# Patient Record
Sex: Female | Born: 1995
Health system: Southern US, Community
[De-identification: ages and names within clinical notes are randomized; demographics above are authoritative.]

## PROBLEM LIST (undated history)

## (undated) DIAGNOSIS — Q969 Turner's syndrome, unspecified: Secondary | ICD-10-CM

## (undated) DIAGNOSIS — E079 Disorder of thyroid, unspecified: Secondary | ICD-10-CM

## (undated) DIAGNOSIS — K529 Noninfective gastroenteritis and colitis, unspecified: Secondary | ICD-10-CM

## (undated) HISTORY — DX: Noninfective gastroenteritis and colitis, unspecified: K52.9

## (undated) HISTORY — DX: Turner's syndrome, unspecified: Q96.9

## (undated) HISTORY — DX: Disorder of thyroid, unspecified: E07.9

---
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
## 2016-01-04 ENCOUNTER — Encounter (HOSPITAL_COMMUNITY): Payer: Self-pay | Admitting: Emergency Medicine

## 2016-01-04 ENCOUNTER — Ambulatory Visit: Payer: Medicaid Other | Admitting: Internal Medicine

## 2016-01-04 DIAGNOSIS — K529 Noninfective gastroenteritis and colitis, unspecified: Secondary | ICD-10-CM | POA: Diagnosis not present

## 2016-01-04 DIAGNOSIS — R1010 Upper abdominal pain, unspecified: Secondary | ICD-10-CM | POA: Diagnosis present

## 2016-01-04 LAB — COMPREHENSIVE METABOLIC PANEL
ALK PHOS: 101 U/L (ref 38–126)
ALT: 20 U/L (ref 14–54)
ANION GAP: 10 (ref 5–15)
AST: 21 U/L (ref 15–41)
Albumin: 5 g/dL (ref 3.5–5.0)
BILIRUBIN TOTAL: 1.1 mg/dL (ref 0.3–1.2)
BUN: 16 mg/dL (ref 6–20)
CALCIUM: 9.2 mg/dL (ref 8.9–10.3)
CO2: 24 mmol/L (ref 22–32)
Chloride: 103 mmol/L (ref 101–111)
Creatinine, Ser: 0.61 mg/dL (ref 0.44–1.00)
GLUCOSE: 107 mg/dL — AB (ref 65–99)
POTASSIUM: 3.9 mmol/L (ref 3.5–5.1)
Sodium: 137 mmol/L (ref 135–145)
TOTAL PROTEIN: 7.6 g/dL (ref 6.5–8.1)

## 2016-01-04 LAB — CBC
HEMATOCRIT: 42.3 % (ref 36.0–46.0)
HEMOGLOBIN: 15.1 g/dL — AB (ref 12.0–15.0)
MCH: 31.5 pg (ref 26.0–34.0)
MCHC: 35.7 g/dL (ref 30.0–36.0)
MCV: 88.1 fL (ref 78.0–100.0)
Platelets: 370 10*3/uL (ref 150–400)
RBC: 4.8 MIL/uL (ref 3.87–5.11)
RDW: 12.1 % (ref 11.5–15.5)
WBC: 17 10*3/uL — ABNORMAL HIGH (ref 4.0–10.5)

## 2016-01-04 LAB — I-STAT BETA HCG BLOOD, ED (MC, WL, AP ONLY)

## 2016-01-04 LAB — LIPASE, BLOOD: Lipase: 28 U/L (ref 11–51)

## 2016-01-04 NOTE — ED Triage Notes (Signed)
Pt states that she has had NVD x 3 days and has been unable to eat or drink anything and keep it down. States that after she vomits the pain starts in her abdomen and radiates into her chest. Alert andoriented.

## 2016-01-04 NOTE — ED Notes (Addendum)
Pt's sister informed EMT first that pt is dehydrated and needs fluids and asked where the lab work is at. It was explained to the family member that we cannot discuss lab work but it anything was critical they would have called us. Furthermore, the RN explained and apologized that the department is full and there are a few people ahead of her but we are doing our best to get her back as soon as possible. Sister then stated that if she was not back in 30 mins she would want to talk to a patient advocate and would be filing a report. Charge RN made aware.

## 2016-01-05 ENCOUNTER — Emergency Department (HOSPITAL_COMMUNITY): Payer: Medicaid Other

## 2016-01-05 ENCOUNTER — Telehealth: Payer: Self-pay | Admitting: Internal Medicine

## 2016-01-05 ENCOUNTER — Encounter (HOSPITAL_COMMUNITY): Payer: Self-pay

## 2016-01-05 ENCOUNTER — Emergency Department (HOSPITAL_COMMUNITY)
Admission: EM | Admit: 2016-01-05 | Discharge: 2016-01-05 | Disposition: A | Discharge status: Home / Self Care | Payer: Medicaid Other | Attending: Emergency Medicine | Admitting: Emergency Medicine

## 2016-01-05 DIAGNOSIS — K529 Noninfective gastroenteritis and colitis, unspecified: Secondary | ICD-10-CM

## 2016-01-05 LAB — URINALYSIS, ROUTINE W REFLEX MICROSCOPIC
BILIRUBIN URINE: NEGATIVE
Glucose, UA: NEGATIVE mg/dL
HGB URINE DIPSTICK: NEGATIVE
Ketones, ur: 80 mg/dL — AB
NITRITE: NEGATIVE
Protein, ur: 30 mg/dL — AB
SPECIFIC GRAVITY, URINE: 1.032 — AB (ref 1.005–1.030)
pH: 6 (ref 5.0–8.0)

## 2016-01-05 MED ORDER — METRONIDAZOLE 500 MG PO TABS: 500.0000 mg | ORAL_TABLET | Freq: Three times a day (TID) | ORAL | 0 refills | Status: DC

## 2016-01-05 MED ORDER — IOPAMIDOL (ISOVUE-300) INJECTION 61%
100.0000 mL | Freq: Once | INTRAVENOUS | Status: AC | PRN
Start: 2016-01-05 — End: 2016-01-05
  Administered 2016-01-05: 80 mL via INTRAVENOUS

## 2016-01-05 MED ORDER — IOPAMIDOL (ISOVUE-300) INJECTION 61%
INTRAVENOUS | Status: AC
  Administered 2016-01-05: 80 mL via INTRAVENOUS
  Filled 2016-01-05: qty 100

## 2016-01-05 MED ORDER — SODIUM CHLORIDE 0.9 % IJ SOLN
INTRAMUSCULAR | Status: AC
  Filled 2016-01-05: qty 50

## 2016-01-05 MED ORDER — KETOROLAC TROMETHAMINE 30 MG/ML IJ SOLN
15.0000 mg | Freq: Once | INTRAMUSCULAR | Status: AC
  Administered 2016-01-05: 15 mg via INTRAVENOUS
  Filled 2016-01-05: qty 1

## 2016-01-05 MED ORDER — SODIUM CHLORIDE 0.9 % IV BOLUS (SEPSIS)
2000.0000 mL | INTRAVENOUS | Status: AC
  Administered 2016-01-05: 2000 mL via INTRAVENOUS

## 2016-01-05 MED ORDER — ONDANSETRON HCL 4 MG/2ML IJ SOLN
4.0000 mg | Freq: Once | INTRAMUSCULAR | Status: AC
  Administered 2016-01-05: 4 mg via INTRAVENOUS
  Filled 2016-01-05: qty 2

## 2016-01-05 MED ORDER — PROMETHAZINE HCL 25 MG PO TABS: 25.0000 mg | ORAL_TABLET | Freq: Four times a day (QID) | ORAL | 0 refills | Status: DC | PRN

## 2016-01-05 MED ORDER — CIPROFLOXACIN HCL 500 MG PO TABS: 500.0000 mg | ORAL_TABLET | Freq: Two times a day (BID) | ORAL | 0 refills | Status: DC

## 2016-01-05 MED ORDER — FAMOTIDINE IN NACL 20-0.9 MG/50ML-% IV SOLN
20.0000 mg | INTRAVENOUS | Status: AC
  Administered 2016-01-05: 20 mg via INTRAVENOUS
  Filled 2016-01-05: qty 50

## 2016-01-05 MED ORDER — DICYCLOMINE HCL 20 MG PO TABS: 20.0000 mg | ORAL_TABLET | Freq: Two times a day (BID) | ORAL | 0 refills | Status: DC | PRN

## 2016-01-05 NOTE — ED Notes (Signed)
Spoke with pt family member due to wait time. Pt family member unhappy with pt still in lobby. Pt family advised that staff was working diligently to get pt back as quickly as possible. Labs have been drawn and resulted at this time. AC notified of pt family request for grievance.

## 2016-01-05 NOTE — ED Notes (Signed)
Up to b/r, steady gait, states, "feel fine", [facility], [facility] CT results.

## 2016-01-05 NOTE — Telephone Encounter (Signed)
Patient was in the ED all night for ? Colitis.  ED note is not complete.  Mom is asking about doing the stool studies that were ordered back in October that they never completed. She is advised that she should be seen in the office again. She  Can't come in until next week for OV.  She is offered an appt for Monday, but she is not able to make it.  She will come in and see Doug SouJessica Zehr, PA on 01/12/16 2:00

## 2016-01-05 NOTE — ED Notes (Signed)
No changes, alert, NAD, calm. [facility]

## 2016-01-05 NOTE — Discharge Instructions (Signed)
Take Ciprofloxacin and Flagyl as prescribed. You may take Bentyl for abdominal pain that is not well controlled with Tylenol or ibuprofen. You may take Phenergan for nausea/vomiting as needed. Be sure to remain well hydrated by drinking plenty of clear liquids. Return to the emergency department for worsening symptoms. Otherwise, follow up with your gastroenterologist and primary care doctor.

## 2016-01-05 NOTE — ED Notes (Signed)
Updated with plan, process and rationale, timeframe given. Pt alert, NAD, calm, interactive, resps e/u, sister leaving BS.

## 2016-01-05 NOTE — ED Notes (Signed)
Lab results reviewed, pt resting comfortably, NAD, calm, interactive, resps e/u, no dyspnea noted, skin W&D, c/o abd pain, 6/10, also nausea. Emesis resolved (nothing left to vomit, no wretching noted), reports diarrhea resolved 2d ago (was watery and brown), last BM today (formed, small), (denies: dizziness, sob, constipation, vaginal or urinary sx, bleeding, fever or other sx). [facility]

## 2016-01-05 NOTE — ED Notes (Signed)
CT scan delayed d/t ICU pt, pt updated, alert, NAD, calm, polite, cooperative, denies questions or needs at this time.

## 2016-01-05 NOTE — ED Provider Notes (Signed)
[facility]-EMERGENCY DEPT Provider Note   CSN: [id number] Arrival date & time: 01/04/16  2009    History   Chief Complaint Chief Complaint  Patient presents with  . Abdominal Pain  . Emesis    HPI Rebecca Mack is a 20 y.o. female.  20 year old female with a history of Turner syndrome presents to the emergency department for evaluation of nausea and vomiting as well as abdominal pain. Symptoms have been worsening over the past 3 days. Patient reports that she has been unable to eat or drink anything without vomiting. She notes that eating and drinking makes her pain worse. She is complaining primarily of upper abdominal pain. This radiates up into her chest. She denies radiation through to her back. Patient has not taken any medications for symptoms, because she is unable to tolerate them. She has had bowel movements and is passing gas. No melena or hematochezia. No associated fevers. Patient reports a history of colitis. She is followed by a gastroenterologist. No history of abdominal surgeries.   The history is provided by the patient. No language interpreter was used.    Past Medical History:  Diagnosis Date  . Thyroid disease   . Turner's syndrome     There are no active problems to display for this patient.   History reviewed. No pertinent surgical history.  OB History    No data available       Home Medications    Prior to Admission medications   Medication Sig Start Date End Date Taking? Authorizing Provider  estradiol (ESTRACE) 1 MG tablet Take 0.5 mg by mouth daily.   Yes Historical Provider, MD  levothyroxine (SYNTHROID, LEVOTHROID) 75 MCG tablet  09/07/14  Yes Historical Provider, MD  ciprofloxacin (CIPRO) 500 MG tablet Take 1 tablet (500 mg total) by mouth every 12 (twelve) hours. 01/05/16   Antony MaduraKelly Brenton Joines, PA-C  dicyclomine (BENTYL) 20 MG tablet Take 1 tablet (20 mg total) by mouth 2 (two) times daily as needed (abdominal pain/cramping). 01/05/16   Antony MaduraKelly Daanya Lanphier,  PA-C  metroNIDAZOLE (FLAGYL) 500 MG tablet Take 1 tablet (500 mg total) by mouth 3 (three) times daily. 01/05/16   Antony MaduraKelly Mikiah Durall, PA-C  promethazine (PHENERGAN) 25 MG tablet Take 1 tablet (25 mg total) by mouth every 6 (six) hours as needed for nausea or vomiting. 01/05/16   Antony MaduraKelly Milagro Belmares, PA-C    Family History Family History  Problem Relation Age of Onset  . Thyroid disease Mother   . Lung disease Mother   . Other Mother     underdeveloped colon  . Diabetes type II Mother   . Neurologic Disorder Mother   . Colon polyps Maternal Grandmother     Social History Social History  Substance Use Topics  . Smoking status: Never Smoker  . Smokeless tobacco: Not on file  . Alcohol use No     Allergies   Other   Review of Systems Review of Systems Ten systems reviewed and are negative for acute change, except as noted in the HPI.    Physical Exam Updated Vital Signs BP 108/69 (BP Location: Left Arm)   Pulse 105   Temp 97.8 F (36.6 C) (Oral)   Resp 19   SpO2 97%   Physical Exam  Constitutional: She is oriented to person, place, and time. She appears well-developed and well-nourished. No distress.  Nontoxic/nonseptic appearing  HENT:  Head: Normocephalic and atraumatic.  Eyes: Conjunctivae and EOM are normal. No scleral icterus.  Neck: Normal range of motion.  Cardiovascular:  Regular rhythm and intact distal pulses.   Borderline tachycardia  Pulmonary/Chest: Effort normal. No respiratory distress. She has no wheezes. She has no rales.  Lungs CTAB  Abdominal: Soft. There is tenderness. There is guarding. There is no rebound.  Mild distension with epigastric TTP as well as LUQ and LLQ tenderness. No masses or peritoneal signs.  Musculoskeletal: Normal range of motion.  Neurological: She is alert and oriented to person, place, and time. She exhibits normal muscle tone. Coordination normal.  Skin: Skin is warm and dry. No rash noted. She is not diaphoretic. No erythema. No  pallor.  Psychiatric: She has a normal mood and affect. Her behavior is normal.  Nursing note and vitals reviewed.    ED Treatments / Results  Labs (all labs ordered are listed, but only abnormal results are displayed) Labs Reviewed  COMPREHENSIVE METABOLIC PANEL - Abnormal; Notable for the following:       Result Value   Glucose, Bld 107 (*)    All other components within normal limits  CBC - Abnormal; Notable for the following:    WBC 17.0 (*)    Hemoglobin 15.1 (*)    All other components within normal limits  URINALYSIS, ROUTINE W REFLEX MICROSCOPIC - Abnormal; Notable for the following:    APPearance HAZY (*)    Specific Gravity, Urine 1.032 (*)    Ketones, ur 80 (*)    Protein, ur 30 (*)    Leukocytes, UA MODERATE (*)    Bacteria, UA RARE (*)    Squamous Epithelial / LPF 6-30 (*)    All other components within normal limits  LIPASE, BLOOD  I-STAT BETA HCG BLOOD, ED ([facility], [facility], [facility] ONLY)    EKG  EKG Interpretation None       Radiology Ct Abdomen Pelvis W Contrast  Result Date: 01/05/2016 CLINICAL DATA:  20 year old female with abdominal pain, nausea and vomiting. EXAM: CT ABDOMEN AND PELVIS WITH CONTRAST TECHNIQUE: Multidetector CT imaging of the abdomen and pelvis was performed using the standard protocol following bolus administration of intravenous contrast. CONTRAST:  80 cc Isovue 300 COMPARISON:  Abdominal CT dated 12/20/2014 FINDINGS: Lower chest: The visualized lung bases are clear. No intra-abdominal free air or free fluid. Hepatobiliary: No focal liver abnormality is seen. No gallstones, gallbladder wall thickening, or biliary dilatation. Pancreas: Unremarkable. No pancreatic ductal dilatation or surrounding inflammatory changes. Spleen: Normal in size without focal abnormality. Adrenals/Urinary Tract: Adrenal glands are unremarkable. Kidneys are normal, without renal calculi, focal lesion, or hydronephrosis. Bladder is unremarkable. Stomach/Bowel: There is mild  diffuse thickened appearance of the colon with mild haziness of the omentum along the transverse colon concerning for mild colitis. Correlation with clinical exam and stool cultures recommended. The stomach is decompressed. There is no evidence of bowel obstruction. Normal appendix. Vascular/Lymphatic: The abdominal aorta and IVC appear unremarkable. No portal venous gas identified. There is no adenopathy. Top-normal mesenteric and right lower quadrant/pericecal lymph nodes noted, possibly reactive. Reproductive: The uterus is grossly unremarkable. Other: None Musculoskeletal: No acute or significant osseous findings. IMPRESSION: Mild diffuse appearance of the colon primarily involving the transverse segment concerning for mild colitis. Correlation with clinical exam is recommended. No bowel obstruction. Normal appendix. Electronically Signed   By: Elgie CollardArash  Radparvar M.D.   On: 01/05/2016 05:51    Procedures Procedures (including critical care time)  Medications Ordered in ED Medications  sodium chloride 0.9 % bolus 2,000 mL (0 mLs Intravenous Stopped 01/05/16 0439)  ondansetron (ZOFRAN) injection 4 mg (4 mg  Intravenous Given 01/05/16 0359)  famotidine (PEPCID) IVPB 20 mg premix (0 mg Intravenous Stopped 01/05/16 0439)  ketorolac (TORADOL) 30 MG/ML injection 15 mg (15 mg Intravenous Given 01/05/16 0359)  iopamidol (ISOVUE-300) 61 % injection 100 mL (80 mLs Intravenous Contrast Given 01/05/16 0534)     Initial Impression / Assessment and Plan / ED Course  I have reviewed the triage vital signs and the nursing notes.  Pertinent labs & imaging results that were available during my care of the patient were reviewed by me and considered in my medical decision making (see chart for details).  Clinical Course     20 year old female presents to the emergency department for evaluation of abdominal pain. She has a leukocytosis today, but is afebrile. Patient nontoxic and without evidence of acute  surgical abdomen, though she is focally tender in the epigastric abdomen and left upper quadrant. CT scan obtained given history of colitis. There is evidence of uncomplicated colitis, again, today. Suspect infectious etiology.   Patient feels better after receiving fluids, Toradol, Pepcid, and Zofran. She has been able to tolerate oral fluids in the emergency department without significant worsening of her pain or vomiting. I believe she is stable for outpatient management at this time. Will start on ciprofloxacin and Flagyl. Patient prescribed Bentyl to take for pain. She has a gastroenterologist with whom she is followed by. The patient is able to follow-up with her GI doctor on an outpatient basis as well. Return precautions discussed and provided. Patient discharged in stable condition with no unaddressed concerns.   Final Clinical Impressions(s) / ED Diagnoses   Final diagnoses:  Colitis    New Prescriptions Discharge Medication List as of 01/05/2016  6:52 AM    START taking these medications   Details  ciprofloxacin (CIPRO) 500 MG tablet Take 1 tablet (500 mg total) by mouth every 12 (twelve) hours., Starting Thu 01/05/2016, Print    dicyclomine (BENTYL) 20 MG tablet Take 1 tablet (20 mg total) by mouth 2 (two) times daily as needed (abdominal pain/cramping)., Starting Thu 01/05/2016, Print    metroNIDAZOLE (FLAGYL) 500 MG tablet Take 1 tablet (500 mg total) by mouth 3 (three) times daily., Starting Thu 01/05/2016, Print    promethazine (PHENERGAN) 25 MG tablet Take 1 tablet (25 mg total) by mouth every 6 (six) hours as needed for nausea or vomiting., Starting Thu 01/05/2016, Print         [place], PA-C 01/06/16 1610    Arby Barrette, MD 01/14/16 0800

## 2016-01-05 NOTE — ED Notes (Signed)
updated

## 2016-01-05 NOTE — ED Notes (Signed)
Bed: WA05 Expected date:  Expected time:  Means of arrival:  Comments: 

## 2016-01-12 ENCOUNTER — Ambulatory Visit: Payer: Medicaid Other | Admitting: Gastroenterology

## 2016-01-25 ENCOUNTER — Ambulatory Visit (INDEPENDENT_AMBULATORY_CARE_PROVIDER_SITE_OTHER): Payer: Medicaid Other | Admitting: Gastroenterology

## 2016-01-25 ENCOUNTER — Encounter (INDEPENDENT_AMBULATORY_CARE_PROVIDER_SITE_OTHER): Payer: Self-pay

## 2016-01-25 ENCOUNTER — Encounter: Payer: Self-pay | Admitting: Gastroenterology

## 2016-01-25 ENCOUNTER — Ambulatory Visit: Payer: Medicaid Other | Admitting: Gastroenterology

## 2016-01-25 VITALS — BP 108/70 | HR 62 | Ht <= 58 in | Wt 120.0 lb

## 2016-01-25 DIAGNOSIS — R197 Diarrhea, unspecified: Secondary | ICD-10-CM | POA: Diagnosis not present

## 2016-01-25 DIAGNOSIS — R938 Abnormal findings on diagnostic imaging of other specified body structures: Secondary | ICD-10-CM

## 2016-01-25 DIAGNOSIS — R9389 Abnormal findings on diagnostic imaging of other specified body structures: Secondary | ICD-10-CM

## 2016-01-25 NOTE — Patient Instructions (Signed)

## 2016-01-25 NOTE — Progress Notes (Signed)
     01/25/2016 Rebecca BarbMeghan Mack [id number] 06/20/1995   History of Present Illness:  This is a 21 year old female with Turner's syndrome who now has now had two episodes of diarrhea and abdominal pain that have shown some changes c/w colitis on CT scan (11/2014 and now 12/2015).  Treated as infectious on both occasions.  At the point her symptoms have completely resolved again, but even between these episodes she sometimes has episodes of diarrhea, etc.  No bleeding with the most recent episode but had bleeding with her episode in 11/2014.  Had WBC count elevation of 17 at her ER visit in December.  CT scan as follows:    IMPRESSION: Mild diffuse appearance of the colon primarily involving the transverse segment concerning for mild colitis. Correlation with clinical exam is recommended. No bowel obstruction. Normal appendix.   Current Medications, Allergies, Past Medical History, Past Surgical History, Family History and Social History were reviewed in [facility] electronic medical record.   Physical Exam: BP 108/70   Pulse 62   Ht 4' 5.5" (1.359 m)   Wt 120 lb (54.4 kg)   BMI 29.48 kg/m  General: Well developed white female in no acute distress; Turner's body habitus. Head: Normocephalic and atraumatic Eyes:  Sclerae anicteric, conjunctiva pink  Ears: Normal auditory acuity Lungs: Clear throughout to auscultation.  No increased WOB. Heart: Regular rate and rhythm. Abdomen: Soft, non-distended.  Normal bowel sounds.  Non-tender. Rectal:  Will be done at the time of colonoscopy. Musculoskeletal: Symmetrical with no gross deformities  Extremities: No edema  Neurological: Alert oriented x 4, grossly non-focal Psychological:  Alert and cooperative. Normal mood and affect  Assessment and Recommendations: -21 year old female with now two episodes of diarrhea, abdominal pain that have shown some changes c/w colitis on CT scan.  Treated as infectious on both occasions.  I think at this  point we should proceed with colonoscopy to be sure that she does not have any underlying inflammatory bowel disease.  Patient and mother are in agreement.  Will schedule for colonoscopy with Dr. Leone PayorGessner.  The risks, benefits, and alternatives to colonoscopy were discussed with the patient and she consents to proceed.

## 2016-01-26 NOTE — Progress Notes (Signed)
Agree with Ms. Zehr's management.  Kenichi Cassada E. Kobey Sides, MD, FACG  

## 2016-02-27 ENCOUNTER — Ambulatory Visit: Payer: Medicaid Other | Admitting: Internal Medicine

## 2016-03-20 ENCOUNTER — Telehealth: Payer: Self-pay | Admitting: Gastroenterology

## 2016-03-21 NOTE — Telephone Encounter (Signed)
The pt was advised to take imodium as needed until the time she needs to prep for her colon.

## 2016-03-29 ENCOUNTER — Encounter: Payer: Medicaid Other | Admitting: Internal Medicine

## 2016-04-13 ENCOUNTER — Other Ambulatory Visit (HOSPITAL_COMMUNITY): Payer: Self-pay | Admitting: Internal Medicine

## 2016-04-13 DIAGNOSIS — Q969 Turner's syndrome, unspecified: Secondary | ICD-10-CM

## 2016-04-27 ENCOUNTER — Ambulatory Visit (HOSPITAL_COMMUNITY): Payer: Medicaid Other

## 2016-05-09 ENCOUNTER — Ambulatory Visit (HOSPITAL_COMMUNITY): Payer: Medicaid Other

## 2016-05-18 ENCOUNTER — Ambulatory Visit (HOSPITAL_COMMUNITY): Payer: Medicaid Other

## 2016-05-25 ENCOUNTER — Ambulatory Visit (HOSPITAL_COMMUNITY): Payer: Medicaid Other

## 2016-06-06 ENCOUNTER — Ambulatory Visit (HOSPITAL_COMMUNITY)
Admission: RE | Admit: 2016-06-06 | Discharge: 2016-06-06 | Disposition: A | Discharge status: Home / Self Care | Payer: Medicaid Other | Source: Ambulatory Visit | Attending: Internal Medicine | Admitting: Internal Medicine

## 2016-06-06 DIAGNOSIS — Q969 Turner's syndrome, unspecified: Secondary | ICD-10-CM | POA: Diagnosis not present

## 2016-06-06 NOTE — Progress Notes (Signed)
  Echocardiogram 2D Echocardiogram has been performed.  Leta JunglingCooper, Keziah Avis M 06/06/2016, 2:45 PM

## 2016-06-12 ENCOUNTER — Encounter: Payer: Medicaid Other | Admitting: Internal Medicine

## 2016-09-04 ENCOUNTER — Ambulatory Visit: Payer: Self-pay | Admitting: Family Medicine

## 2016-09-14 ENCOUNTER — Ambulatory Visit: Payer: Medicaid Other | Admitting: Family Medicine

## 2016-10-12 ENCOUNTER — Ambulatory Visit: Payer: Self-pay | Admitting: Endocrinology

## 2016-11-16 ENCOUNTER — Ambulatory Visit: Payer: Self-pay | Admitting: Endocrinology

## 2017-01-04 ENCOUNTER — Ambulatory Visit: Payer: Self-pay | Admitting: Endocrinology

## 2017-02-06 ENCOUNTER — Ambulatory Visit: Payer: Self-pay | Admitting: Endocrinology

## 2017-02-06 DIAGNOSIS — Z0289 Encounter for other administrative examinations: Secondary | ICD-10-CM

## 2017-11-07 IMAGING — CT CT ABD-PELV W/ CM
2 of 4 series · 16 of 46 positions shown, 18 images · IV contrast (ISOVUE)
Comparison: Abdominal CT dated 12/20/2014

CLINICAL DATA: 20-year-old female with abdominal pain, nausea and
vomiting.

EXAM:
CT ABDOMEN AND PELVIS WITH CONTRAST
TECHNIQUE: Multidetector CT imaging of the abdomen and pelvis was performed
using the standard protocol following bolus administration of
intravenous contrast.
CONTRAST:  80 cc Isovue 300

[Series 2: abd/pel with · axial · 0.65mm/px · z∈[+1325,+1635]mm · 13 of 70 slices shown, 15 images]
[im 4/70  soft-tissue]
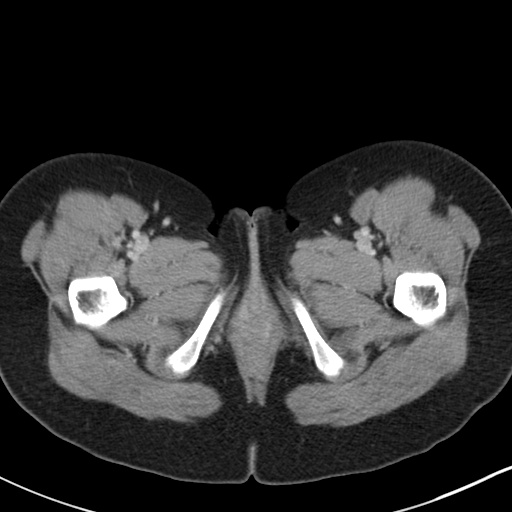
[im 4/70  bone]
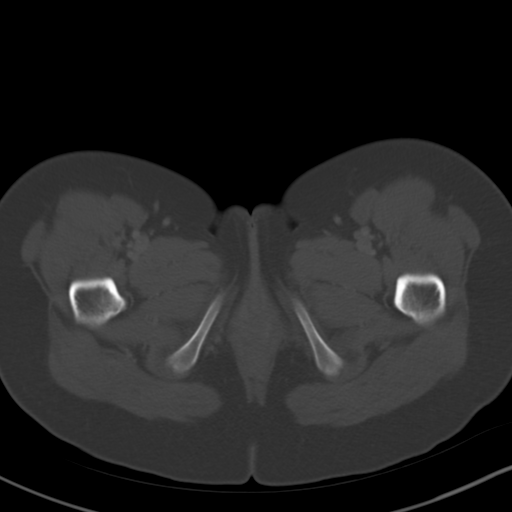
[im 11/70  soft-tissue]
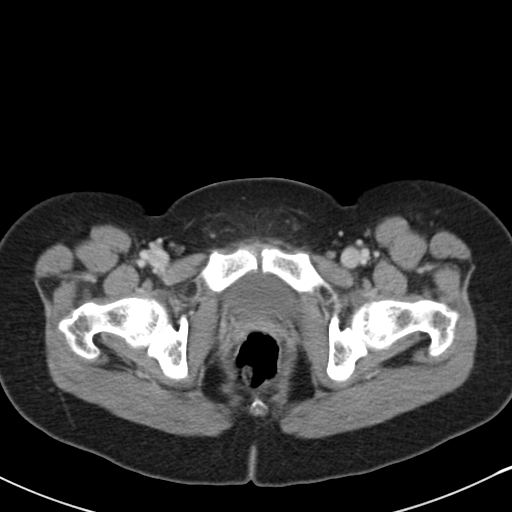
[im 14/70  soft-tissue]
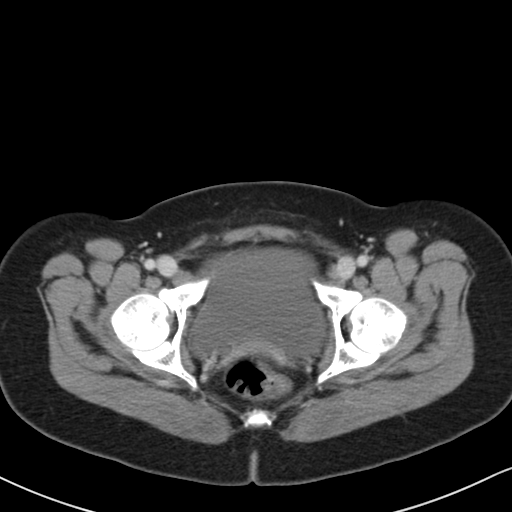
[im 21/70  soft-tissue]
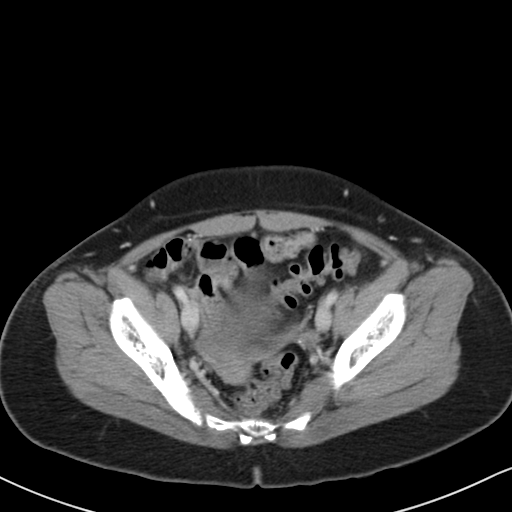
[im 25/70  soft-tissue]
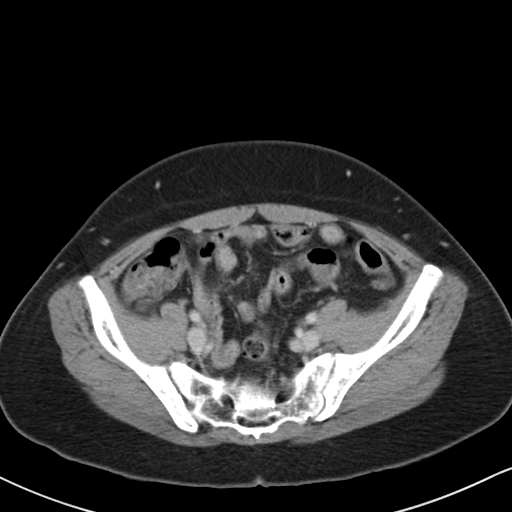
[im 32/70  soft-tissue]
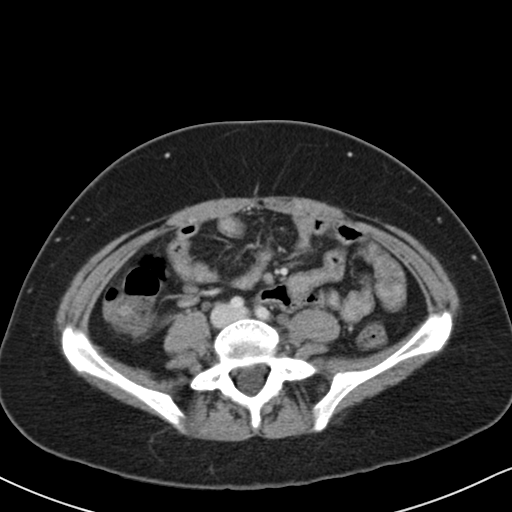
[im 35/70  soft-tissue]
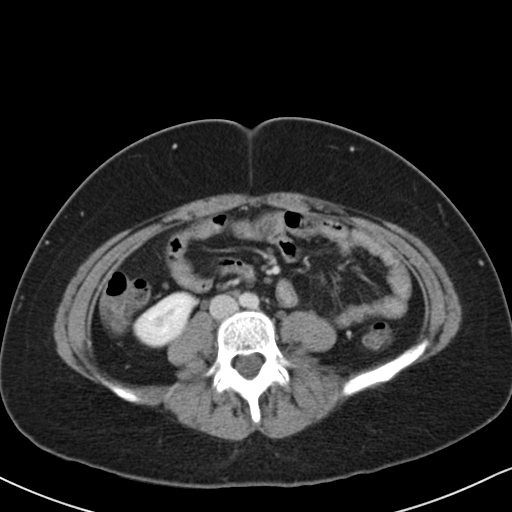
[im 38/70  soft-tissue]
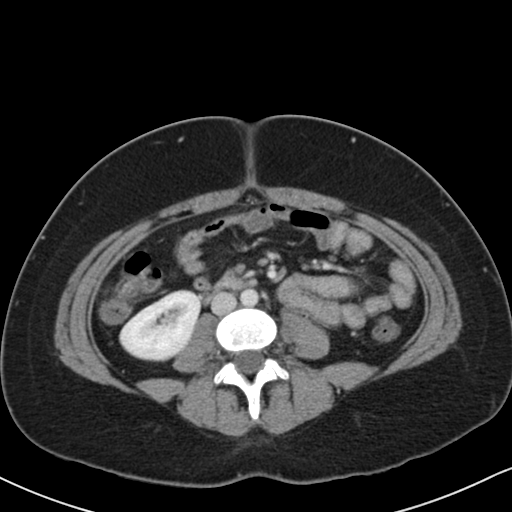
[im 45/70  soft-tissue]
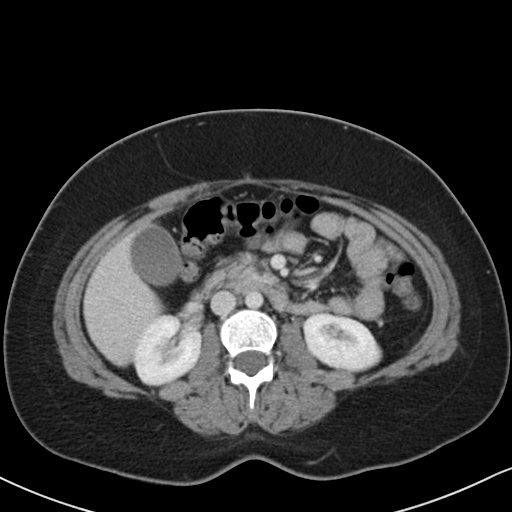
[im 45/70  bone]
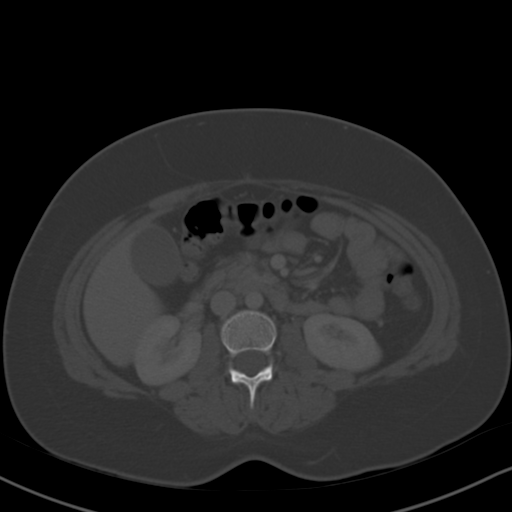
[im 49/70  soft-tissue]
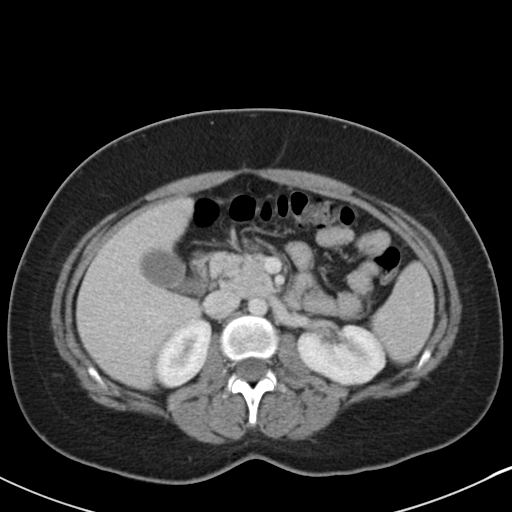
[im 56/70  soft-tissue]
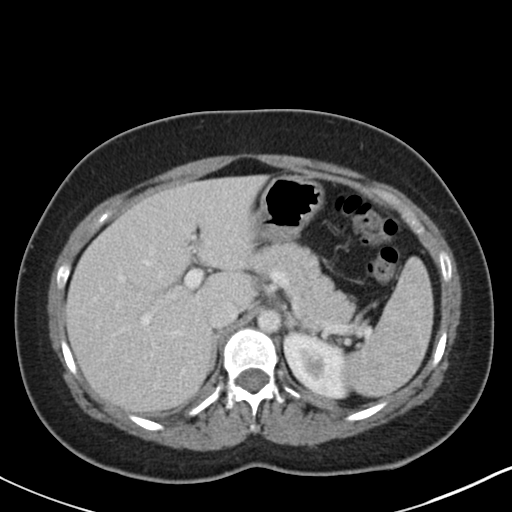
[im 59/70  soft-tissue]
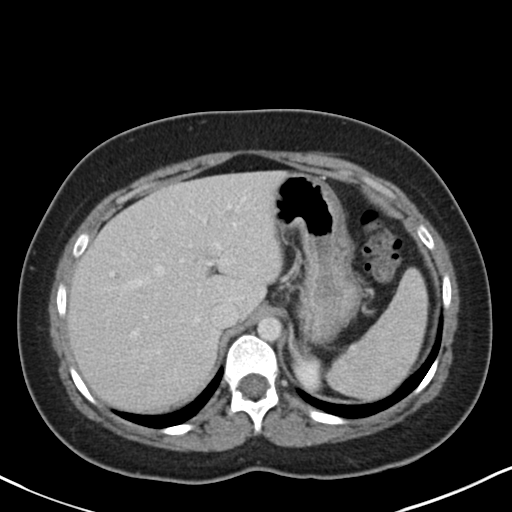
[im 66/70  soft-tissue]
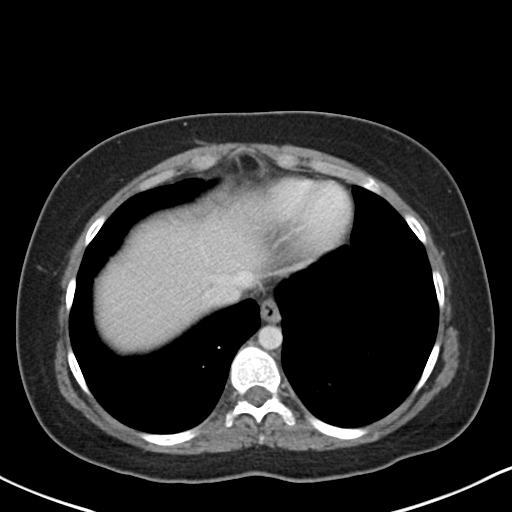

[Series 4: coronal a/|p · coronal · 0.58mm/px · 3 of 108 slices shown]
[im 36/108  soft-tissue]
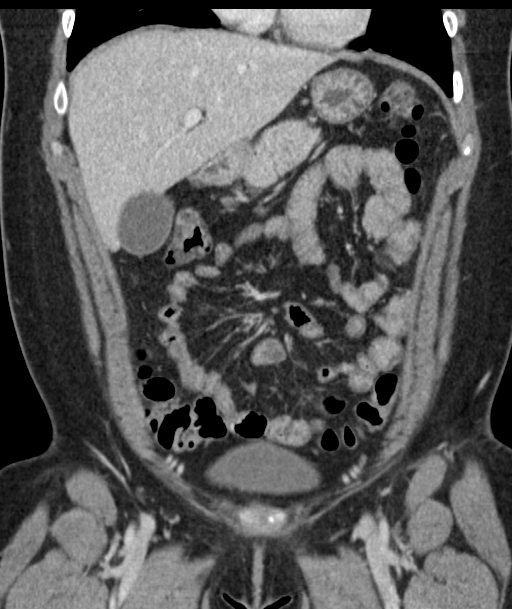
[im 48/108  soft-tissue]
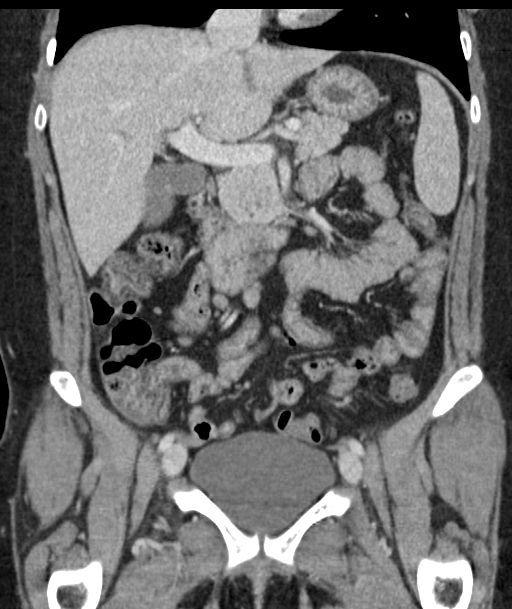
[im 60/108  soft-tissue]
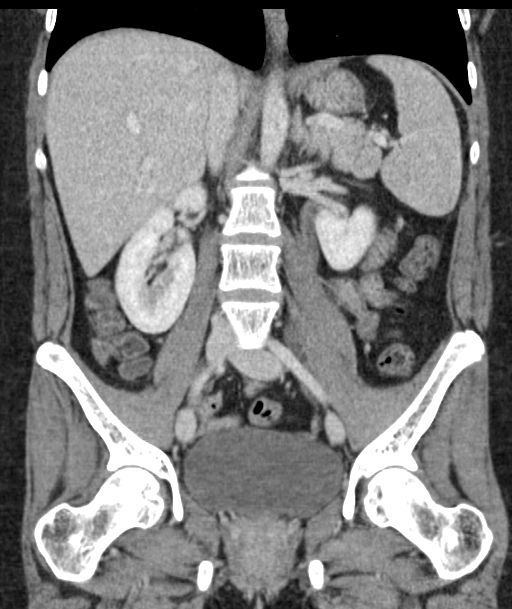

[16 of 46 positions shown; findings below may reference images not displayed]

FINDINGS: Lower chest: The visualized lung bases are clear.

No intra-abdominal free air or free fluid.

Hepatobiliary: No focal liver abnormality is seen. No gallstones,
gallbladder wall thickening, or biliary dilatation.

Pancreas: Unremarkable. No pancreatic ductal dilatation or
surrounding inflammatory changes.

Spleen: Normal in size without focal abnormality.

Adrenals/Urinary Tract: Adrenal glands are unremarkable. Kidneys are
normal, without renal calculi, focal lesion, or hydronephrosis.
Bladder is unremarkable.

Stomach/Bowel: There is mild diffuse thickened appearance of the
colon with mild haziness of the omentum along the transverse colon
concerning for mild colitis. Correlation with clinical exam and
stool cultures recommended. The stomach is decompressed. There is no
evidence of bowel obstruction. Normal appendix.

Vascular/Lymphatic: The abdominal aorta and IVC appear unremarkable.
No portal venous gas identified. There is no adenopathy. Top-normal
mesenteric and right lower quadrant/pericecal lymph nodes noted,
possibly reactive.

Reproductive: The uterus is grossly unremarkable.

Other: None

Musculoskeletal: No acute or significant osseous findings.
IMPRESSION: Mild diffuse appearance of the colon primarily involving the
transverse segment concerning for mild colitis. Correlation with
clinical exam is recommended. No bowel obstruction. Normal appendix.

## 2019-06-01 ENCOUNTER — Telehealth: Payer: Medicaid Other

## 2021-04-15 ENCOUNTER — Telehealth: Payer: Self-pay | Admitting: Nurse Practitioner

## 2021-04-15 DIAGNOSIS — J0100 Acute maxillary sinusitis, unspecified: Secondary | ICD-10-CM

## 2021-04-15 MED ORDER — AMOXICILLIN-POT CLAVULANATE 875-125 MG PO TABS: 1.0000 | ORAL_TABLET | Freq: Two times a day (BID) | ORAL | 0 refills | Status: AC

## 2021-04-15 MED ORDER — FLUTICASONE PROPIONATE 50 MCG/ACT NA SUSP: 2.0000 | Freq: Every day | NASAL | 6 refills | Status: AC

## 2021-04-15 NOTE — Patient Instructions (Signed)
1. Take meds as prescribed ?2. Use a cool mist humidifier especially during the winter months and when heat has been humid. ?3. Use saline nose sprays frequently ?4. Saline irrigations of the nose can be very helpful if done frequently. ? * 4X daily for 1 week* ? * Use of a nettie pot can be helpful with this. Follow directions with this* ?5. Drink plenty of fluids ?6. Keep thermostat turn down low ?7.For any cough or congestion- mucinex D ?8. For fever or aces or pains- take tylenol or ibuprofen appropriate for age and weight. ? * for fevers greater than 101 orally you may alternate ibuprofen and tylenol every  3 hours. ?  ? ?

## 2021-04-15 NOTE — Progress Notes (Signed)
? ?Virtual Visit Consent  ? ?Rebecca Mack, you are scheduled for a virtual visit with Mary-Margaret Daphine Deutscher, FNP, a [facility] provider, today.   ?  ?Just as with appointments in the office, your consent must be obtained to participate.  Your consent will be active for this visit and any virtual visit you may have with one of our providers in the next 365 days.   ?  ?If you have a MyChart account, a copy of this consent can be sent to you electronically.  All virtual visits are billed to your insurance company just like a traditional visit in the office.   ? ?As this is a virtual visit, video technology does not allow for your provider to perform a traditional examination.  This may limit your provider's ability to fully assess your condition.  If your provider identifies any concerns that need to be evaluated in person or the need to arrange testing (such as labs, EKG, etc.), we will make arrangements to do so.   ?  ?Although advances in technology are sophisticated, we cannot ensure that it will always work on either your end or our end.  If the connection with a video visit is poor, the visit may have to be switched to a telephone visit.  With either a video or telephone visit, we are not always able to ensure that we have a secure connection.    ? ?I need to obtain your verbal consent now.   Are you willing to proceed with your visit today? YES ?  ?Tian Davison has provided verbal consent on 04/15/2021 for a virtual visit (video or telephone). ?  ?Mary-Margaret Daphine Deutscher, FNP  ? ?Date: 04/15/2021 1:20 PM ? ? ?Virtual Visit via Video Note  ? ?I, Mary-Margaret Daphine Deutscher, connected with Brissa Asante ([id number], 08/14/1995) on 04/15/21 at  1:15 PM EDT by a video-enabled telemedicine application and verified that I am speaking with the correct person using two identifiers. ? ?Location: ?Patient: Virtual Visit Location Patient: Home ?Provider: Virtual Visit Location Provider: Mobile ?  ?I discussed the limitations of  evaluation and management by telemedicine and the availability of in person appointments. The patient expressed understanding and agreed to proceed.   ? ?History of Present Illness: ?Rebecca Mack is a 26 y.o. who identifies as a female who was assigned female at birth, and is being seen today for sinusitis. ? ?HPI: Sinus Problem ?This is a new problem. The current episode started in the past 7 days. The problem has been waxing and waning since onset. The maximum temperature recorded prior to her arrival was 100.4 - 100.9 F. The fever has been present for 1 to 2 days. Her pain is at a severity of 4/10. The pain is moderate. Associated symptoms include congestion, coughing, headaches, sinus pressure and a sore throat. Past treatments include oral decongestants. The treatment provided mild relief.   ?Review of Systems  ?HENT:  Positive for congestion, sinus pressure and sore throat.   ?Respiratory:  Positive for cough.   ?Neurological:  Positive for headaches.  ? ?Problems:  ?Patient Active Problem List  ? Diagnosis Date Noted  ? Diarrhea 01/25/2016  ? Abnormal CT scan 01/25/2016  ?  ?Allergies:  ?Allergies  ?Allergen Reactions  ? Other Other (See Comments)  ?  Pt cant take steroids.   ? ?Medications:  ?Current Outpatient Medications:  ?  amoxicillin-clavulanate (AUGMENTIN) 875-125 MG tablet, Take 1 tablet by mouth 2 (two) times daily., Disp: 14 tablet, Rfl: 0 ?  fluticasone (  FLONASE) 50 MCG/ACT nasal spray, Place 2 sprays into both nostrils daily., Disp: 16 g, Rfl: 6 ?  estradiol (ESTRACE) 1 MG tablet, Take 0.5 mg by mouth daily., Disp: , Rfl:  ?  levothyroxine (SYNTHROID, LEVOTHROID) 75 MCG tablet, , Disp: , Rfl: 0 ? ?Observations/Objective: ?Patient is well-developed, well-nourished in no acute distress.  ?Resting comfortably  at home.  ?Head is normocephalic, atraumatic.  ?No labored breathing.  ?Speech is clear and coherent with logical content.  ?Patient is alert and oriented at baseline.  ?Hoarse voice ?No  cough noted during visit ? ?Assessment and Plan: ? ?Angela Plair in today with chief complaint of Sinus Problem ? ? ?1. Acute non-recurrent maxillary sinusitis ?1. Take meds as prescribed ?2. Use a cool mist humidifier especially during the winter months and when heat has been humid. ?3. Use saline nose sprays frequently ?4. Saline irrigations of the nose can be very helpful if done frequently. ? * 4X daily for 1 week* ? * Use of a nettie pot can be helpful with this. Follow directions with this* ?5. Drink plenty of fluids ?6. Keep thermostat turn down low ?7.For any cough or congestion- mucinex D ?8. For fever or aces or pains- take tylenol or ibuprofen appropriate for age and weight. ? * for fevers greater than 101 orally you may alternate ibuprofen and tylenol every  3 hours. ?  ? ?Meds ordered this encounter  ?Medications  ? fluticasone (FLONASE) 50 MCG/ACT nasal spray  ?  Sig: Place 2 sprays into both nostrils daily.  ?  Dispense:  16 g  ?  Refill:  6  ?  Order Specific Question:   Supervising Provider  ?  Answer:   Eber Hong [[id number]]  ? amoxicillin-clavulanate (AUGMENTIN) 875-125 MG tablet  ?  Sig: Take 1 tablet by mouth 2 (two) times daily.  ?  Dispense:  14 tablet  ?  Refill:  0  ?  Order Specific Question:   Supervising Provider  ?  Answer:   Eber Hong [[id number]]  ? ? ? ? ? ? ?Follow Up Instructions: ?I discussed the assessment and treatment plan with the patient. The patient was provided an opportunity to ask questions and all were answered. The patient agreed with the plan and demonstrated an understanding of the instructions.  A copy of instructions were sent to the patient via MyChart. ? ?The patient was advised to call back or seek an in-person evaluation if the symptoms worsen or if the condition fails to improve as anticipated. ? ?Time:  ?I spent 7 minutes with the patient via telehealth technology discussing the above problems/concerns.   ? ?Mary-Margaret Daphine Deutscher, FNP ? ?

## 2021-05-15 DIAGNOSIS — Z8371 Family history of colonic polyps: Secondary | ICD-10-CM | POA: Diagnosis not present

## 2021-05-15 DIAGNOSIS — K640 First degree hemorrhoids: Secondary | ICD-10-CM | POA: Diagnosis not present

## 2021-05-15 DIAGNOSIS — K648 Other hemorrhoids: Secondary | ICD-10-CM | POA: Diagnosis not present

## 2021-05-15 DIAGNOSIS — K5289 Other specified noninfective gastroenteritis and colitis: Secondary | ICD-10-CM | POA: Diagnosis not present

## 2021-05-15 DIAGNOSIS — Z8719 Personal history of other diseases of the digestive system: Secondary | ICD-10-CM | POA: Diagnosis not present

## 2021-05-15 DIAGNOSIS — K529 Noninfective gastroenteritis and colitis, unspecified: Secondary | ICD-10-CM | POA: Diagnosis not present

## 2021-05-15 DIAGNOSIS — Q969 Turner's syndrome, unspecified: Secondary | ICD-10-CM | POA: Diagnosis not present

## 2021-06-26 DIAGNOSIS — R002 Palpitations: Secondary | ICD-10-CM | POA: Diagnosis not present

## 2021-06-26 DIAGNOSIS — Q969 Turner's syndrome, unspecified: Secondary | ICD-10-CM | POA: Diagnosis not present

## 2021-07-05 DIAGNOSIS — I498 Other specified cardiac arrhythmias: Secondary | ICD-10-CM | POA: Diagnosis not present

## 2021-11-10 DIAGNOSIS — M549 Dorsalgia, unspecified: Secondary | ICD-10-CM | POA: Diagnosis not present

## 2021-11-10 DIAGNOSIS — R110 Nausea: Secondary | ICD-10-CM | POA: Diagnosis not present

## 2021-11-10 DIAGNOSIS — S20229A Contusion of unspecified back wall of thorax, initial encounter: Secondary | ICD-10-CM | POA: Diagnosis not present

## 2021-11-10 DIAGNOSIS — R079 Chest pain, unspecified: Secondary | ICD-10-CM | POA: Diagnosis not present

## 2021-11-10 DIAGNOSIS — Z888 Allergy status to other drugs, medicaments and biological substances status: Secondary | ICD-10-CM | POA: Diagnosis not present

## 2021-11-10 DIAGNOSIS — M545 Low back pain, unspecified: Secondary | ICD-10-CM | POA: Diagnosis not present

## 2021-12-08 DIAGNOSIS — Z888 Allergy status to other drugs, medicaments and biological substances status: Secondary | ICD-10-CM | POA: Diagnosis not present

## 2021-12-08 DIAGNOSIS — K209 Esophagitis, unspecified without bleeding: Secondary | ICD-10-CM | POA: Diagnosis not present

## 2021-12-08 DIAGNOSIS — Z79899 Other long term (current) drug therapy: Secondary | ICD-10-CM | POA: Diagnosis not present

## 2021-12-08 DIAGNOSIS — R0789 Other chest pain: Secondary | ICD-10-CM | POA: Diagnosis not present

## 2021-12-08 DIAGNOSIS — R233 Spontaneous ecchymoses: Secondary | ICD-10-CM | POA: Diagnosis not present

## 2021-12-08 DIAGNOSIS — R079 Chest pain, unspecified: Secondary | ICD-10-CM | POA: Diagnosis not present

## 2021-12-08 DIAGNOSIS — Q969 Turner's syndrome, unspecified: Secondary | ICD-10-CM | POA: Diagnosis not present

## 2021-12-08 DIAGNOSIS — E079 Disorder of thyroid, unspecified: Secondary | ICD-10-CM | POA: Diagnosis not present

## 2023-03-30 ENCOUNTER — Encounter: Payer: Self-pay | Admitting: Nurse Practitioner

## 2023-03-30 ENCOUNTER — Telehealth: Admitting: Nurse Practitioner

## 2023-03-30 DIAGNOSIS — J40 Bronchitis, not specified as acute or chronic: Secondary | ICD-10-CM

## 2023-03-30 MED ORDER — PSEUDOEPH-BROMPHEN-DM 30-2-10 MG/5ML PO SYRP: 5.0000 mL | ORAL_SOLUTION | Freq: Four times a day (QID) | ORAL | 0 refills | Status: AC | PRN

## 2023-03-30 MED ORDER — BENZONATATE 200 MG PO CAPS: 200.0000 mg | ORAL_CAPSULE | Freq: Two times a day (BID) | ORAL | 0 refills | Status: AC | PRN

## 2023-03-30 MED ORDER — AZITHROMYCIN 250 MG PO TABS: ORAL_TABLET | ORAL | 0 refills | Status: AC

## 2023-03-30 NOTE — Patient Instructions (Signed)
  Rebecca Mack, thank you for joining Rebecca Rigg, NP for today's virtual visit.  While this provider is not your primary care provider (PCP), if your PCP is located in our provider database this encounter information will be shared with them immediately following your visit.   A Rebecca Mack account gives you access to today's visit and all your visits, tests, and labs performed at [facility] " click here if you don't have a [facility] account or go to [email or web address]  Consent: (Patient) Rebecca Mack provided verbal consent for this virtual visit at the beginning of the encounter.  Current Medications:  Current Outpatient Medications:    azithromycin (ZITHROMAX) 250 MG tablet, Take 2 tablets on day 1, then 1 tablet daily on days 2 through 5, Disp: 6 tablet, Rfl: 0   benzonatate (TESSALON) 200 MG capsule, Take 1 capsule (200 mg total) by mouth 2 (two) times daily as needed for cough., Disp: 20 capsule, Rfl: 0   brompheniramine-pseudoephedrine-DM 30-2-10 MG/5ML syrup, Take 5 mLs by mouth 4 (four) times daily as needed., Disp: 240 mL, Rfl: 0   Medications ordered in this encounter:  Meds ordered this encounter  Medications   azithromycin (ZITHROMAX) 250 MG tablet    Sig: Take 2 tablets on day 1, then 1 tablet daily on days 2 through 5    Dispense:  6 tablet    Refill:  0    Supervising Provider:   Merrilee Jansky [[id number]]   brompheniramine-pseudoephedrine-DM 30-2-10 MG/5ML syrup    Sig: Take 5 mLs by mouth 4 (four) times daily as needed.    Dispense:  240 mL    Refill:  0    Supervising Provider:   Merrilee Jansky [[id number]]   benzonatate (TESSALON) 200 MG capsule    Sig: Take 1 capsule (200 mg total) by mouth 2 (two) times daily as needed for cough.    Dispense:  20 capsule    Refill:  0    Supervising Provider:   Merrilee Jansky [[id number]]     *If you need refills on other medications prior to your next appointment, please contact your  pharmacy*  Follow-Up: Call back or seek an in-person evaluation if the symptoms worsen or if the condition fails to improve as anticipated.  [facility] [phone]  Other Instructions INSTRUCTIONS: use a humidifier for nasal congestion Drink plenty of fluids, rest and wash hands frequently to avoid the spread of infection Alternate tylenol and Motrin for relief of fever    If you have been instructed to have an in-person evaluation today at a local Urgent Care facility, please use the link below. It will take you to a list of all of our available [facility], including address, phone number and hours of operation. Please do not delay care.  [facility]  If you or a family member do not have a primary care provider, use the link below to schedule a visit and establish care. When you choose a [facility] primary care physician or advanced practice provider, you gain a long-term partner in health. Find a Primary Care Provider  Learn more about 's in-office and virtual care options:  - Get Care Now

## 2023-03-30 NOTE — Progress Notes (Signed)
 Virtual Visit Consent   [name], you are scheduled for a virtual visit with a [facility] provider today. Just as with appointments in the office, your consent must be obtained to participate. Your consent will be active for this visit and any virtual visit you may have with one of our providers in the next 365 days. If you have a MyChart account, a copy of this consent can be sent to you electronically.  As this is a virtual visit, video technology does not allow for your provider to perform a traditional examination. This may limit your provider's ability to fully assess your condition. If your provider identifies any concerns that need to be evaluated in person or the need to arrange testing (such as labs, EKG, etc.), we will make arrangements to do so. Although advances in technology are sophisticated, we cannot ensure that it will always work on either your end or our end. If the connection with a video visit is poor, the visit may have to be switched to a telephone visit. With either a video or telephone visit, we are not always able to ensure that we have a secure connection.  By engaging in this virtual visit, you consent to the provision of healthcare and authorize for your insurance to be billed (if applicable) for the services provided during this visit. Depending on your insurance coverage, you may receive a charge related to this service.  I need to obtain your verbal consent now. Are you willing to proceed with your visit today? Conception Doebler has provided verbal consent on 03/30/2023 for a virtual visit (video or telephone). Claiborne Rigg, NP  Date: 03/30/2023 6:39 PM   Virtual Visit via Video Note   I, Claiborne Rigg, connected with  Rebecca Mack  ([id number], 04-09-1995) on 03/30/23 at  6:30 PM EST by a video-enabled telemedicine application and verified that I am speaking with the correct person using two identifiers.  Location: Patient: Virtual Visit Location Patient:  Home Provider: Virtual Visit Location Provider: Home Office   I discussed the limitations of evaluation and management by telemedicine and the availability of in person appointments. The patient expressed understanding and agreed to proceed.    History of Present Illness: Rebecca Mack is a 28 y.o. who identifies as a female who was assigned female at birth, and is being seen today for bronchitis.  Ms Lasseter has been experiencing sore throat, laryngitis and hacking productive cough over the past 4 days. Associated symptoms include fatigue. She can not take steroids. Has been treating symptoms with dayquil and OTC decongestants with tylenol.   Problems: There are no active problems to display for this patient.   Allergies: Not on File Medications:  Current Outpatient Medications:    azithromycin (ZITHROMAX) 250 MG tablet, Take 2 tablets on day 1, then 1 tablet daily on days 2 through 5, Disp: 6 tablet, Rfl: 0   benzonatate (TESSALON) 200 MG capsule, Take 1 capsule (200 mg total) by mouth 2 (two) times daily as needed for cough., Disp: 20 capsule, Rfl: 0   brompheniramine-pseudoephedrine-DM 30-2-10 MG/5ML syrup, Take 5 mLs by mouth 4 (four) times daily as needed., Disp: 240 mL, Rfl: 0  Observations/Objective: Patient is well-developed, well-nourished in no acute distress.  Resting comfortably at home.  Head is normocephalic, atraumatic.  No labored breathing.  Speech is clear and coherent with logical content.  Patient is alert and oriented at baseline.    Assessment and Plan: 1. Bronchitis (Primary) - azithromycin (ZITHROMAX) 250  MG tablet; Take 2 tablets on day 1, then 1 tablet daily on days 2 through 5  Dispense: 6 tablet; Refill: 0 - brompheniramine-pseudoephedrine-DM 30-2-10 MG/5ML syrup; Take 5 mLs by mouth 4 (four) times daily as needed.  Dispense: 240 mL; Refill: 0 - benzonatate (TESSALON) 200 MG capsule; Take 1 capsule (200 mg total) by mouth 2 (two) times daily as needed for  cough.  Dispense: 20 capsule; Refill: 0  INSTRUCTIONS: use a humidifier for nasal congestion Drink plenty of fluids, rest and wash hands frequently to avoid the spread of infection Alternate tylenol and Motrin for relief of fever   Follow Up Instructions: I discussed the assessment and treatment plan with the patient. The patient was provided an opportunity to ask questions and all were answered. The patient agreed with the plan and demonstrated an understanding of the instructions.  A copy of instructions were sent to the patient via MyChart unless otherwise noted below.    The patient was advised to call back or seek an in-person evaluation if the symptoms worsen or if the condition fails to improve as anticipated.    Claiborne Rigg, NP

## 2023-05-13 DIAGNOSIS — Z124 Encounter for screening for malignant neoplasm of cervix: Secondary | ICD-10-CM | POA: Diagnosis not present

## 2023-05-13 DIAGNOSIS — Z6828 Body mass index (BMI) 28.0-28.9, adult: Secondary | ICD-10-CM | POA: Diagnosis not present

## 2023-05-13 DIAGNOSIS — Z01419 Encounter for gynecological examination (general) (routine) without abnormal findings: Secondary | ICD-10-CM | POA: Diagnosis not present

## 2023-05-22 DIAGNOSIS — T22031A Burn of unspecified degree of right upper arm, initial encounter: Secondary | ICD-10-CM | POA: Diagnosis not present

## 2023-05-22 DIAGNOSIS — L089 Local infection of the skin and subcutaneous tissue, unspecified: Secondary | ICD-10-CM | POA: Diagnosis not present

## 2023-07-11 DIAGNOSIS — Q969 Turner's syndrome, unspecified: Secondary | ICD-10-CM | POA: Diagnosis not present

## 2023-07-11 DIAGNOSIS — Z8639 Personal history of other endocrine, nutritional and metabolic disease: Secondary | ICD-10-CM | POA: Diagnosis not present

## 2023-07-11 DIAGNOSIS — R197 Diarrhea, unspecified: Secondary | ICD-10-CM | POA: Diagnosis not present

## 2023-07-11 DIAGNOSIS — Z8349 Family history of other endocrine, nutritional and metabolic diseases: Secondary | ICD-10-CM | POA: Diagnosis not present

## 2023-07-25 ENCOUNTER — Ambulatory Visit: Attending: Audiology | Admitting: Audiology

## 2023-08-15 DIAGNOSIS — Q969 Turner's syndrome, unspecified: Secondary | ICD-10-CM | POA: Diagnosis not present

## 2023-08-23 DIAGNOSIS — E039 Hypothyroidism, unspecified: Secondary | ICD-10-CM | POA: Diagnosis not present

## 2023-10-21 DIAGNOSIS — E039 Hypothyroidism, unspecified: Secondary | ICD-10-CM | POA: Diagnosis not present
